# Patient Record
Sex: Male | Born: 1960 | Race: White | Hispanic: No | Marital: Married | Smoking: Never smoker
Health system: Southern US, Community
[De-identification: ages and names within clinical notes are randomized; demographics above are authoritative.]

---

## 2007-12-28 ENCOUNTER — Encounter (INDEPENDENT_AMBULATORY_CARE_PROVIDER_SITE_OTHER): Payer: Self-pay | Admitting: *Deleted

## 2008-02-09 ENCOUNTER — Ambulatory Visit: Payer: Self-pay | Admitting: Family Medicine

## 2008-02-09 DIAGNOSIS — M25569 Pain in unspecified knee: Secondary | ICD-10-CM

## 2008-02-09 DIAGNOSIS — J309 Allergic rhinitis, unspecified: Secondary | ICD-10-CM | POA: Insufficient documentation

## 2008-02-11 ENCOUNTER — Telehealth (INDEPENDENT_AMBULATORY_CARE_PROVIDER_SITE_OTHER): Payer: Self-pay | Admitting: *Deleted

## 2008-02-11 LAB — CONVERTED CEMR LAB
Alkaline Phosphatase: 67 units/L (ref 39–117)
Basophils Absolute: 0.1 10*3/uL (ref 0.0–0.1)
Bilirubin, Direct: 0.1 mg/dL (ref 0.0–0.3)
CO2: 31 meq/L (ref 19–32)
Calcium: 9.4 mg/dL (ref 8.4–10.5)
Cholesterol: 198 mg/dL (ref 0–200)
GFR calc Af Amer: 65 mL/min
Glucose, Bld: 86 mg/dL (ref 70–99)
HDL: 43.7 mg/dL (ref >39.0)
Hemoglobin: 16.1 g/dL (ref 13.0–17.0)
LDL Cholesterol: 120 mg/dL — ABNORMAL HIGH (ref 0–99)
Lymphocytes Relative: 32.7 % (ref 12.0–46.0)
MCHC: 34.8 g/dL (ref 30.0–36.0)
Monocytes Absolute: 1 10*3/uL (ref 0.1–1.0)
Monocytes Relative: 12.1 % — ABNORMAL HIGH (ref 3.0–12.0)
Neutro Abs: 4.1 10*3/uL (ref 1.4–7.7)
Platelets: 254 10*3/uL (ref 150–400)
Potassium: 4 meq/L (ref 3.5–5.1)
RDW: 12.1 % (ref 11.5–14.6)
Sodium: 139 meq/L (ref 135–145)
Total Bilirubin: 0.8 mg/dL (ref 0.3–1.2)
Total CHOL/HDL Ratio: 4.5
Total Protein: 7.5 g/dL (ref 6.0–8.3)
Triglycerides: 173 mg/dL — ABNORMAL HIGH (ref 0–149)
VLDL: 35 mg/dL (ref 0–40)

## 2008-02-15 ENCOUNTER — Ambulatory Visit: Payer: Self-pay | Admitting: Family Medicine

## 2008-02-16 ENCOUNTER — Telehealth (INDEPENDENT_AMBULATORY_CARE_PROVIDER_SITE_OTHER): Payer: Self-pay | Admitting: *Deleted

## 2008-02-19 ENCOUNTER — Encounter (INDEPENDENT_AMBULATORY_CARE_PROVIDER_SITE_OTHER): Payer: Self-pay | Admitting: *Deleted

## 2020-03-27 ENCOUNTER — Emergency Department (HOSPITAL_BASED_OUTPATIENT_CLINIC_OR_DEPARTMENT_OTHER)
Admission: EM | Admit: 2020-03-27 | Discharge: 2020-03-27 | Disposition: A | Discharge status: Home / Self Care | Payer: 59 | Attending: Emergency Medicine | Admitting: Emergency Medicine

## 2020-03-27 ENCOUNTER — Emergency Department (HOSPITAL_BASED_OUTPATIENT_CLINIC_OR_DEPARTMENT_OTHER): Payer: 59

## 2020-03-27 ENCOUNTER — Other Ambulatory Visit: Payer: Self-pay

## 2020-03-27 ENCOUNTER — Encounter (HOSPITAL_BASED_OUTPATIENT_CLINIC_OR_DEPARTMENT_OTHER): Payer: Self-pay

## 2020-03-27 DIAGNOSIS — Y92013 Bedroom of single-family (private) house as the place of occurrence of the external cause: Secondary | ICD-10-CM | POA: Insufficient documentation

## 2020-03-27 DIAGNOSIS — S0083XA Contusion of other part of head, initial encounter: Secondary | ICD-10-CM | POA: Diagnosis present

## 2020-03-27 DIAGNOSIS — H02846 Edema of left eye, unspecified eyelid: Secondary | ICD-10-CM | POA: Diagnosis not present

## 2020-03-27 DIAGNOSIS — S01312A Laceration without foreign body of left ear, initial encounter: Secondary | ICD-10-CM

## 2020-03-27 DIAGNOSIS — W06XXXA Fall from bed, initial encounter: Secondary | ICD-10-CM | POA: Insufficient documentation

## 2020-03-27 DIAGNOSIS — W19XXXA Unspecified fall, initial encounter: Secondary | ICD-10-CM

## 2020-03-27 NOTE — ED Triage Notes (Signed)
Pt states he has had a URI for the past week, been having to wear CPAP. Was trying off CPAP, rolled out of bed, hit face on table. Went to PCP, who placed stitches to left ear. Left eye swollen shut. PCP wanted him to get CT scan

## 2020-03-27 NOTE — ED Provider Notes (Signed)
[facility] EMERGENCY DEPARTMENT Provider Note   CSN: [id number] Arrival date & time: 03/27/20  1427     History Chief Complaint  Patient presents with  . Fall    Joshua Nichols is a 59 y.o. male.  Patient is a 59 year old male with no significant past medical history.  He presents today for evaluation of facial trauma.  Patient was waking up with his CPAP machine on when he lost his balance and fell, striking his head on the nightstand.  Patient had a laceration to the ear and this was sutured at an outside clinic.  He was then referred here for a CT scan of his head and face secondary to the degree of swelling around his left eye.  Patient has significant swelling and ecchymosis of the periorbital tissues, but is able to see when the eye is pried open.  He denies loss of consciousness.  He denies neck pain.  The history is provided by the patient.       History reviewed. No pertinent past medical history.  Patient Active Problem List   Diagnosis Date Noted  . RHINITIS 02/09/2008  . KNEE PAIN, LEFT, CHRONIC 02/09/2008    History reviewed. No pertinent surgical history.     History reviewed. No pertinent family history.  Social History   Tobacco Use  . Smoking status: Never Smoker  Substance Use Topics  . Alcohol use: Never  . Drug use: Never    Home Medications Prior to Admission medications   Medication Sig Start Date End Date Taking? Authorizing Provider  cholecalciferol (VITAMIN D3) 25 MCG (1000 UNIT) tablet Take 1,000 Units by mouth daily.   Yes [provider]  loratadine (CLARITIN) 10 MG tablet Take 2 tablets by mouth daily.    [provider]  Multiple Vitamin (MULTI-VITAMIN) tablet Take 1 tablet by mouth daily.    [provider]    Allergies    Patient has no known allergies.  Review of Systems   Review of Systems  All other systems reviewed and are negative.   Physical Exam Updated Vital Signs BP (!)  163/105 (BP Location: Left Arm)   Pulse 67   Resp 18   Ht 5\' 10"  (1.778 m)   Wt 104.3 kg   SpO2 96%   BMI 33.00 kg/m   Physical Exam Vitals and nursing note reviewed.  Constitutional:      General: He is not in acute distress.    Appearance: Normal appearance. He is not ill-appearing.  HENT:     Head: Normocephalic.     Comments: There is significant swelling and ecchymosis of the periorbital tissues.  The eye is somewhat difficult to examine, but pupil is reactive and there is no obvious hyphema.  I see no evidence for globe rupture. Eyes:     Extraocular Movements: Extraocular movements intact.     Pupils: Pupils are equal, round, and reactive to light.  Neck:     Comments: There is no neck tenderness or step-off.  He has painless range of motion in all directions. Pulmonary:     Effort: Pulmonary effort is normal.  Musculoskeletal:     Cervical back: Normal range of motion and neck supple. No rigidity or tenderness.  Skin:    General: Skin is warm and dry.  Neurological:     General: No focal deficit present.     Mental Status: He is alert and oriented to person, place, and time.     Cranial Nerves:  No cranial nerve deficit.     Motor: No weakness.     Coordination: Coordination normal.     ED Results / Procedures / Treatments   Labs (all labs ordered are listed, but only abnormal results are displayed) Labs Reviewed - No data to display  EKG None  Radiology No results found.  Procedures Procedures (including critical care time)  Medications Ordered in ED Medications - No data to display  ED Course  I have reviewed the triage vital signs and the nursing notes.  Pertinent labs & imaging results that were available during my care of the patient were reviewed by me and considered in my medical decision making (see chart for details).    MDM Rules/Calculators/A&P  Patient presenting with facial injuries as described in the HPI.  He had his ear laceration  repaired at urgent care, then was sent here for imaging studies.  Patient does have marked swelling of the periorbital tissues of the left eye.  I am able to visualize the pupil and it is reactive and I do not see a hyphema.  Patient reports being able to see once the eyelid is pulled apart.  Imaging studies revealed no evidence for fracture or other concerning findings.  At this point, patient seems appropriate for discharge.  I will advise him to ice for 20 minutes every 2 hours while awake for the next 2 days and follow-up as needed.  Final Clinical Impression(s) / ED Diagnoses Final diagnoses:  None    Rx / DC Orders ED Discharge Orders    None       Geoffery Lyons, MD 03/27/20 1727

## 2020-03-27 NOTE — Discharge Instructions (Addendum)
Local wound care with bacitracin and dressing changes twice daily.  Ice for 20 minutes every 2 hours while awake for the next 2 days.  Take ibuprofen 600 mg every 6 hours as needed for pain.  Return to the emergency department if you develop worsening headache, worsening vision, or other new and concerning symptoms.

## 2021-12-21 IMAGING — CT CT MAXILLOFACIAL W/O CM
3 series · 16 of 47 positions shown, 19 images · non-contrast
Comparison: Head CT today is reported separately.

CLINICAL DATA: 59-year-old male status post fall out of bed, struck
face on table.

EXAM:
CT MAXILLOFACIAL WITHOUT CONTRAST
TECHNIQUE: Multidetector CT imaging of the maxillofacial structures was
performed. Multiplanar CT image reconstructions were also generated.

[Series 2: max soft · axial · 0.42mm/px · z∈[+1284,+1428]mm · 10 of 84 slices shown, 13 images]
[im 6/84  brain]
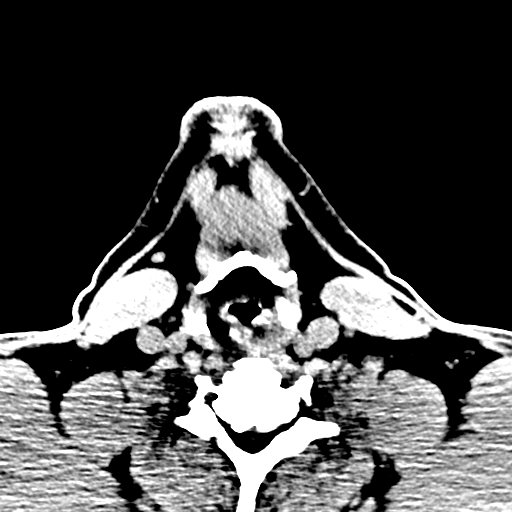
[im 6/84  bone]
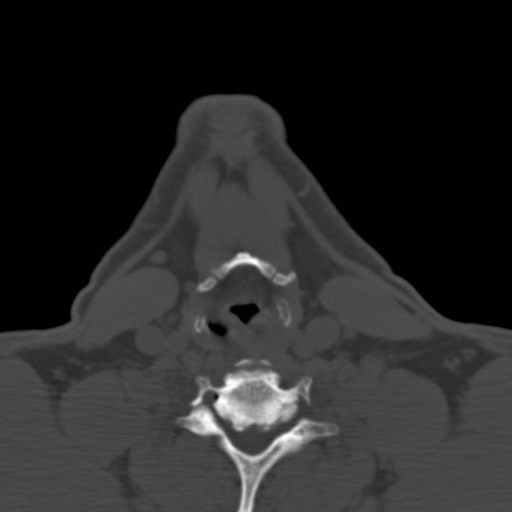
[im 15/84  bone]
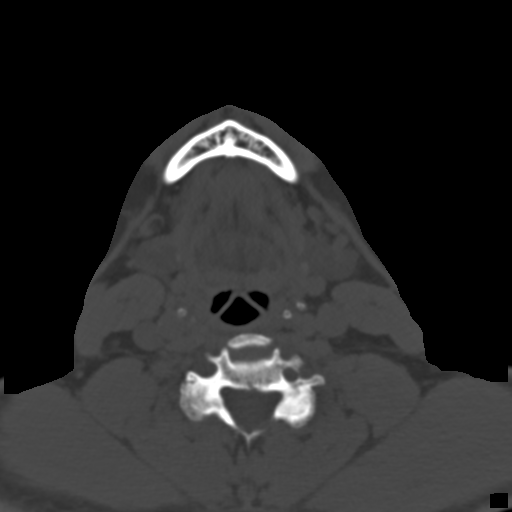
[im 23/84  bone]
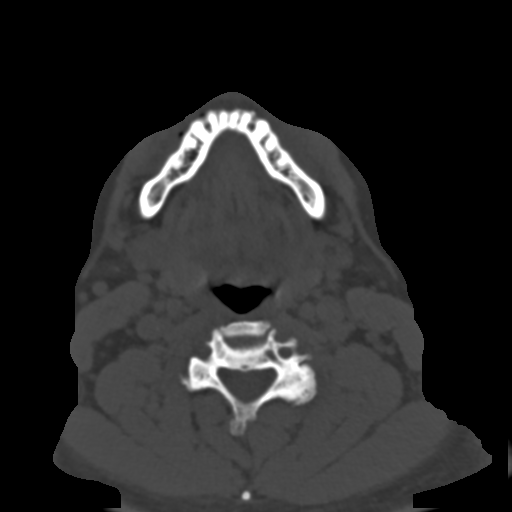
[im 29/84  bone]
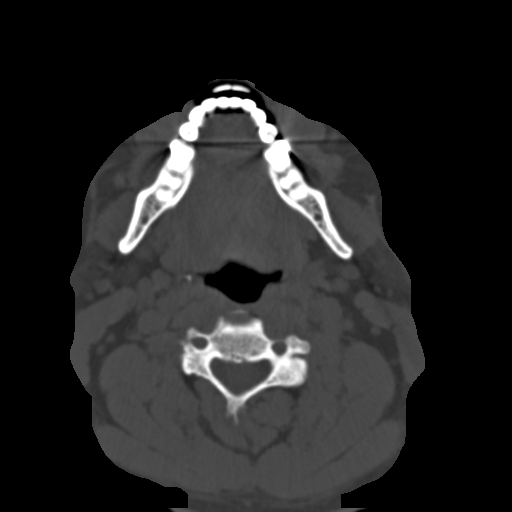
[im 38/84  brain]
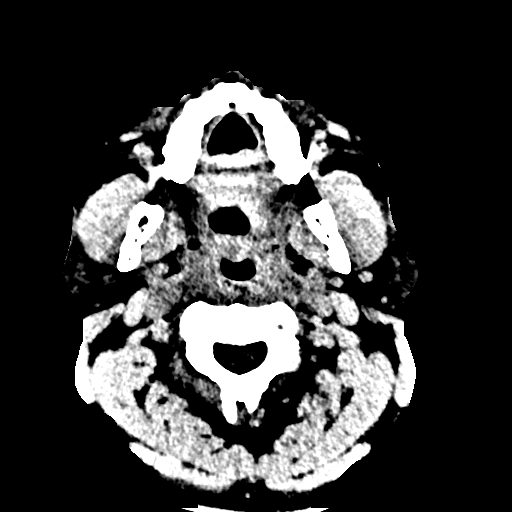
[im 38/84  bone]
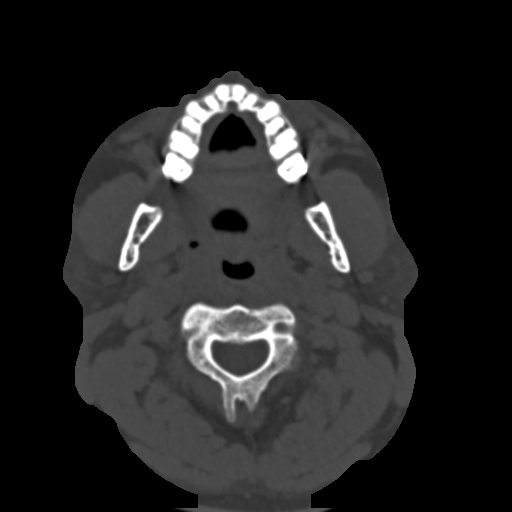
[im 46/84  bone]
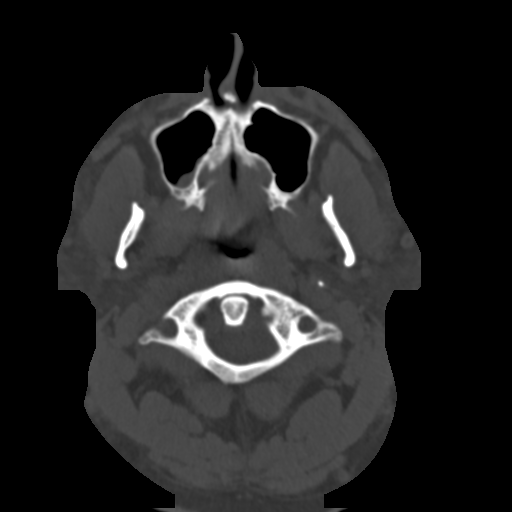
[im 55/84  bone]
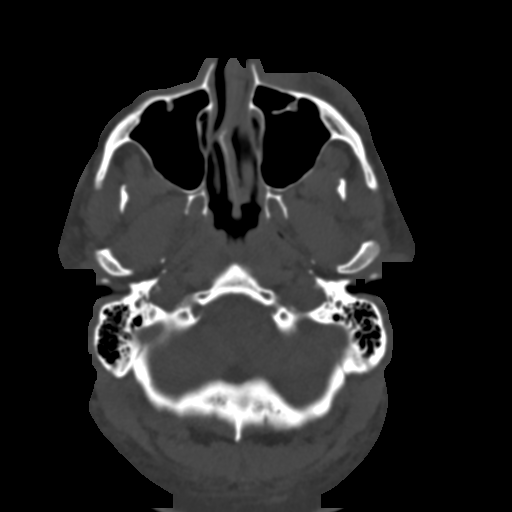
[im 63/84  bone]
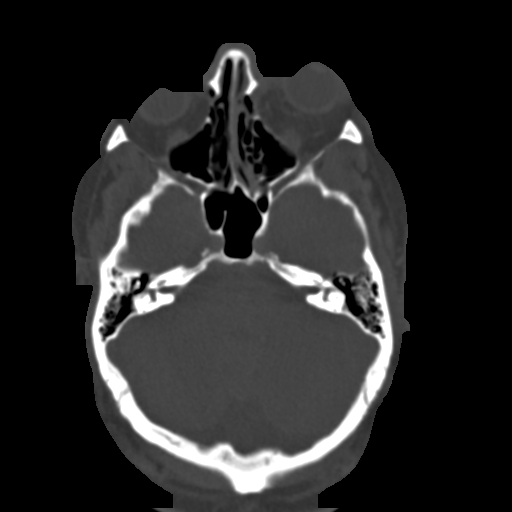
[im 69/84  brain]
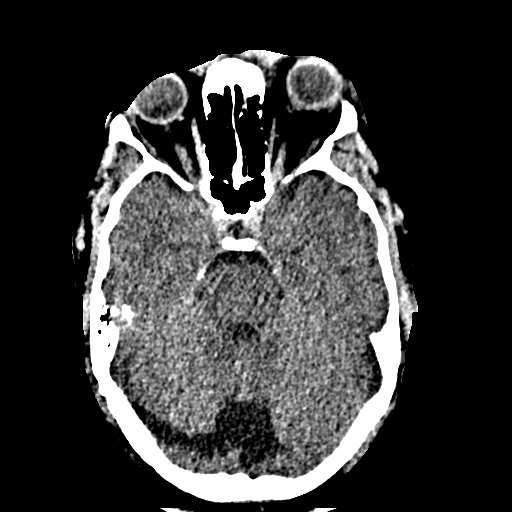
[im 69/84  bone]
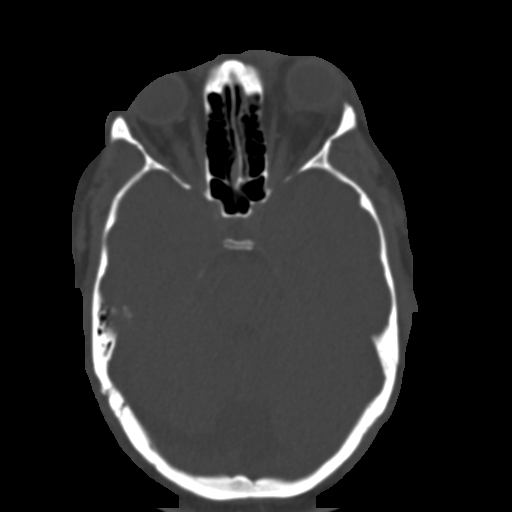
[im 78/84  bone]
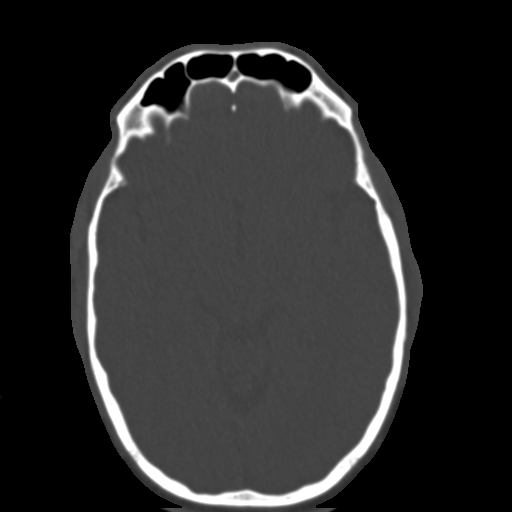

[Series 4: coronal soft · coronal · 0.38mm/px · 3 of 90 slices shown]
[im 30/90  bone]
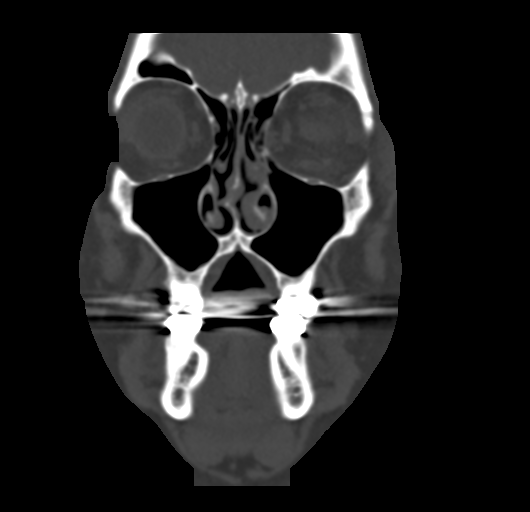
[im 40/90  bone]
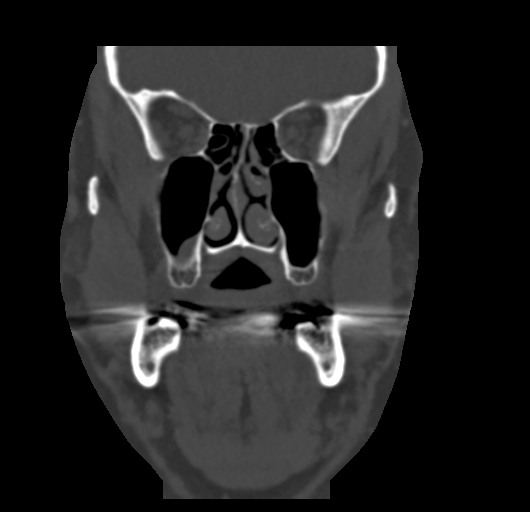
[im 50/90  bone]
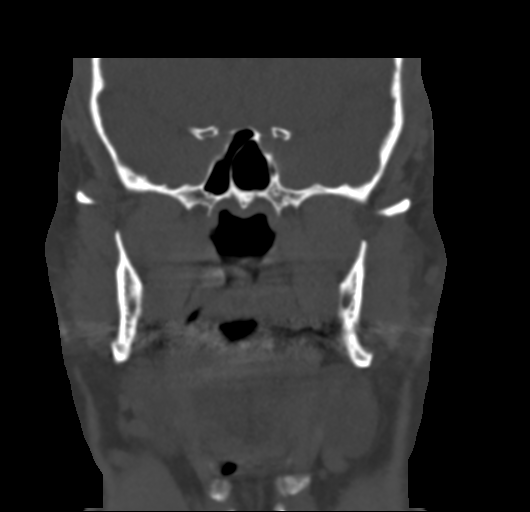

[Series 5: sagittal soft · sagittal · 0.37mm/px · 3 of 99 slices shown]
[im 33/99  bone]
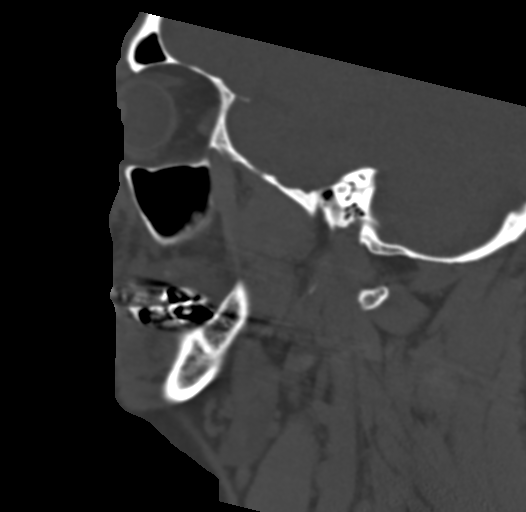
[im 50/99  bone]
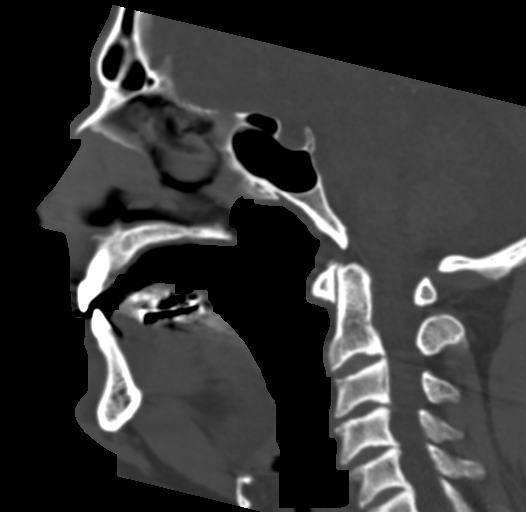
[im 66/99  bone]
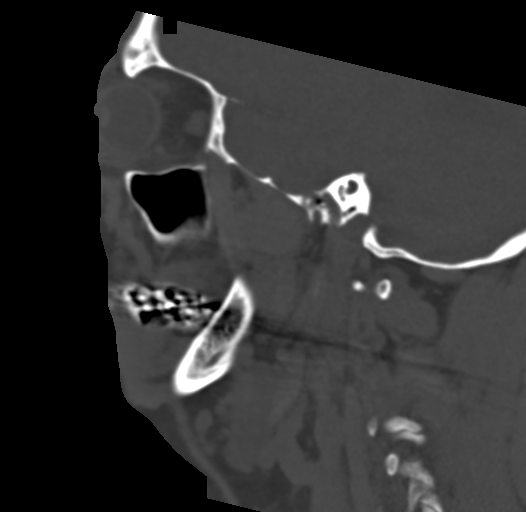

[16 of 47 positions shown; findings below may reference images not displayed]

FINDINGS: Osseous: Mandible is intact and normally located. No acute dental
finding identified. Maxilla and zygoma appear intact.

No acute nasal bone fracture identified.

Orbits: Broad-based left preseptal and periorbital hematoma, up to
15 mm in thickness. There is mild associated intraorbital fat
contusion (series 4, image 36), and mild left side proptosis but but
no discrete postseptal hematoma. The globe appears intact. No other
left intraorbital injury identified.

No underlying left orbital fracture identified. No intraorbital gas.

The right orbit appears normal.

Sinuses: Clear throughout; minimal mucosal thickening right
maxillary alveolar recess. Tympanic cavities and mastoids also
appear clear.

Soft tissues: Negative visible noncontrast deep soft tissue spaces
of the face and neck.

Limited intracranial: Reported separately today.
IMPRESSION: 1. Broad-based left periorbital hematoma with associated mild
postseptal soft tissue contusion and mild proptosis. But no orbital
fracture or discrete intraorbital hematoma.
2. No other acute traumatic injury identified to the face.
3. Head CT today reported separately.
